# Patient Record
Sex: Male | Born: 1990 | Race: Black or African American | Hispanic: No | Marital: Single | Smoking: Never smoker
Health system: Southern US, Community
[De-identification: ages and names within clinical notes are randomized; demographics above are authoritative.]

## PROBLEM LIST (undated history)

## (undated) ENCOUNTER — Ambulatory Visit (HOSPITAL_COMMUNITY): Admission: EM | Payer: BC Managed Care – PPO | Source: Home / Self Care

## (undated) HISTORY — PX: APPENDECTOMY: SHX54

---
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
## 2010-02-14 ENCOUNTER — Ambulatory Visit: Payer: Self-pay | Admitting: Physician Assistant

## 2010-02-28 ENCOUNTER — Encounter: Payer: Self-pay | Admitting: Physician Assistant

## 2010-03-03 ENCOUNTER — Encounter: Payer: Self-pay | Admitting: Physician Assistant

## 2010-03-23 ENCOUNTER — Encounter: Payer: Self-pay | Admitting: Physician Assistant

## 2010-07-04 NOTE — Miscellaneous (Signed)
  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

## 2010-07-04 NOTE — Letter (Signed)
Summary: IMMUNIZATION RECORD  IMMUNIZATION RECORD   Imported By: Arta Bruce 03/23/2010 10:39:37  _____________________________________________________________________  External Attachment:    Type:   Image     Comment:   External Document

## 2010-07-04 NOTE — Letter (Signed)
Summary: PT INFORMATION SHEET  PT INFORMATION SHEET   Imported By: Arta Bruce 02/16/2010 10:48:23  _____________________________________________________________________  External Attachment:    Type:   Image     Comment:   External Document

## 2010-07-04 NOTE — Assessment & Plan Note (Signed)
Summary: new medicaid pt/ first est care/gk   Vital Signs:  Patient profile:   20 year old male Height:      67.52 inches Weight:      142 pounds BMI:     21.98 Temp:     98.3 degrees F oral Pulse rate:   52 / minute Pulse rhythm:   regular Resp:     20 per minute BP sitting:   124 / 80  (left arm) Cuff size:   regular  Vitals Entered By: CMA Studnet Linzie Collin CC: new patient visit, no current issue, medications verified Is Patient Diabetic? No Pain Assessment Patient in pain? no       Does patient need assistance? Functional Status Self care Ambulation Normal   Primary Care Provider:  Tereso Newcomer, PA-C  CC:  new patient visit, no current issue, and medications verified.  History of Present Illness: New pt. Previously going to [facility]. No complaints. Sees optometrist and dentist. Goes to [employer] and wants to transfer to a 4 yr university.   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   Head:  normocephalic and atraumatic.   Eyes:  pupils equal, pupils round, and pupils reactive to light.   Ears:  R ear normal and L ear normal.   Nose:  no external deformity.   Mouth:  pharynx pink and moist.   Neck:  supple and no cervical lymphadenopathy.   Lungs:  normal breath sounds.   Heart:  normal rate and regular rhythm.   Abdomen:  soft, non-tender, and no hepatomegaly.   Extremities:  no edema Neurologic:  alert & oriented X3 and cranial  nerves II-XII intact.   Psych:  normally interactive.     Impression & Recommendations:  Problem # 1:  PREVENTIVE HEALTH CARE (ICD-V70.0) will get imm's records from [facility]  Patient Instructions: 1)  Please request immunization records from [facility]. 2)  Please schedule a follow-up appointment as needed .

## 2010-07-04 NOTE — Letter (Signed)
Summary: PPD SKIN TEST  PPD SKIN TEST   Imported By: Arta Bruce 03/23/2010 10:56:58  _____________________________________________________________________  External Attachment:    Type:   Image     Comment:   External Document

## 2010-07-04 NOTE — Letter (Signed)
Summary: REQUESTING RECORDS FROM [facility]  REQUESTING RECORDS FROM [facility]   Imported By: Arta Bruce 03/06/2010 12:15:33  _____________________________________________________________________  External Attachment:    Type:   Image     Comment:   External Document

## 2010-07-31 ENCOUNTER — Inpatient Hospital Stay (INDEPENDENT_AMBULATORY_CARE_PROVIDER_SITE_OTHER)
Admission: RE | Admit: 2010-07-31 | Discharge: 2010-07-31 | Disposition: A | Discharge status: Home / Self Care | Payer: Medicaid Other | Source: Ambulatory Visit | Attending: Emergency Medicine | Admitting: Emergency Medicine

## 2010-07-31 DIAGNOSIS — R51 Headache: Secondary | ICD-10-CM

## 2010-09-11 LAB — CBC
HCT: 45.5 % (ref 39.0–52.0)
Hemoglobin: 14.3 g/dL (ref 13.0–17.0)
MCHC: 31.5 g/dL (ref 30.0–36.0)
MCV: 75.3 fL — ABNORMAL LOW (ref 78.0–100.0)
RDW: 14.7 % (ref 11.5–15.5)

## 2010-09-11 LAB — DIFFERENTIAL
Basophils Absolute: 0.1 10*3/uL (ref 0.0–0.1)
Basophils Relative: 1 % (ref 0–1)
Lymphocytes Relative: 17 % (ref 12–46)
Monocytes Relative: 16 % — ABNORMAL HIGH (ref 3–12)
Neutro Abs: 3 10*3/uL (ref 1.7–7.7)
Neutrophils Relative %: 65 % (ref 43–77)

## 2010-09-11 LAB — COMPREHENSIVE METABOLIC PANEL
Alkaline Phosphatase: 112 U/L (ref 39–117)
BUN: 16 mg/dL (ref 6–23)
Creatinine, Ser: 1.1 mg/dL (ref 0.4–1.5)
Glucose, Bld: 80 mg/dL (ref 70–99)
Potassium: 4.7 mEq/L (ref 3.5–5.1)
Total Protein: 7.7 g/dL (ref 6.0–8.3)

## 2010-09-19 ENCOUNTER — Ambulatory Visit: Payer: Medicaid Other | Admitting: Family Medicine

## 2010-11-09 ENCOUNTER — Ambulatory Visit: Payer: Medicaid Other | Admitting: Family Medicine

## 2011-03-01 LAB — DIFFERENTIAL
Lymphocytes Relative: 13 — ABNORMAL LOW
Monocytes Absolute: 0.5
Monocytes Relative: 5
Neutro Abs: 7.2

## 2011-03-01 LAB — CBC
Hemoglobin: 12.5
MCHC: 32.3
RBC: 5.3

## 2011-03-07 ENCOUNTER — Emergency Department (HOSPITAL_COMMUNITY)
Admission: EM | Admit: 2011-03-07 | Discharge: 2011-03-07 | Discharge status: Against Medical Advice | Payer: Self-pay | Attending: Emergency Medicine | Admitting: Emergency Medicine

## 2011-03-07 DIAGNOSIS — H5789 Other specified disorders of eye and adnexa: Secondary | ICD-10-CM | POA: Insufficient documentation

## 2015-11-04 ENCOUNTER — Encounter (HOSPITAL_COMMUNITY): Payer: Self-pay | Admitting: Emergency Medicine

## 2015-11-04 ENCOUNTER — Emergency Department (HOSPITAL_COMMUNITY)
Admission: EM | Admit: 2015-11-04 | Discharge: 2015-11-04 | Disposition: A | Discharge status: Home / Self Care | Payer: Medicaid Other | Attending: Emergency Medicine | Admitting: Emergency Medicine

## 2015-11-04 DIAGNOSIS — A084 Viral intestinal infection, unspecified: Secondary | ICD-10-CM | POA: Insufficient documentation

## 2015-11-04 LAB — CBC
HEMATOCRIT: 42.2 % (ref 39.0–52.0)
Hemoglobin: 13.8 g/dL (ref 13.0–17.0)
MCH: 24 pg — ABNORMAL LOW (ref 26.0–34.0)
MCHC: 32.7 g/dL (ref 30.0–36.0)
MCV: 73.4 fL — ABNORMAL LOW (ref 78.0–100.0)
Platelets: 179 10*3/uL (ref 150–400)
RBC: 5.75 MIL/uL (ref 4.22–5.81)
RDW: 14.3 % (ref 11.5–15.5)
WBC: 9.6 10*3/uL (ref 4.0–10.5)

## 2015-11-04 LAB — LIPASE, BLOOD: Lipase: 19 U/L (ref 11–51)

## 2015-11-04 LAB — COMPREHENSIVE METABOLIC PANEL
ALK PHOS: 43 U/L (ref 38–126)
ALT: 14 U/L — ABNORMAL LOW (ref 17–63)
ANION GAP: 9 (ref 5–15)
AST: 26 U/L (ref 15–41)
Albumin: 4.4 g/dL (ref 3.5–5.0)
BILIRUBIN TOTAL: 1.3 mg/dL — AB (ref 0.3–1.2)
BUN: 17 mg/dL (ref 6–20)
CALCIUM: 9.2 mg/dL (ref 8.9–10.3)
CO2: 24 mmol/L (ref 22–32)
Chloride: 103 mmol/L (ref 101–111)
Creatinine, Ser: 1.07 mg/dL (ref 0.61–1.24)
GFR calc Af Amer: >60 mL/min (ref >60)
Glucose, Bld: 155 mg/dL — ABNORMAL HIGH (ref 65–99)
POTASSIUM: 3.4 mmol/L — AB (ref 3.5–5.1)
Sodium: 136 mmol/L (ref 135–145)
TOTAL PROTEIN: 7.5 g/dL (ref 6.5–8.1)

## 2015-11-04 MED ORDER — SODIUM CHLORIDE 0.9 % IV BOLUS (SEPSIS)
1000.0000 mL | Freq: Once | INTRAVENOUS | Status: AC
  Administered 2015-11-04: 1000 mL via INTRAVENOUS

## 2015-11-04 MED ORDER — KETOROLAC TROMETHAMINE 30 MG/ML IJ SOLN
30.0000 mg | Freq: Once | INTRAMUSCULAR | Status: AC
  Administered 2015-11-04: 30 mg via INTRAVENOUS
  Filled 2015-11-04: qty 1

## 2015-11-04 MED ORDER — METOCLOPRAMIDE HCL 5 MG/ML IJ SOLN
10.0000 mg | INTRAMUSCULAR | Status: AC
  Administered 2015-11-04: 10 mg via INTRAVENOUS
  Filled 2015-11-04: qty 2

## 2015-11-04 MED ORDER — DICYCLOMINE HCL 20 MG PO TABS: 20.0000 mg | ORAL_TABLET | Freq: Two times a day (BID) | ORAL | Status: DC

## 2015-11-04 MED ORDER — LORAZEPAM 2 MG/ML IJ SOLN
1.0000 mg | Freq: Once | INTRAMUSCULAR | Status: AC
  Administered 2015-11-04: 1 mg via INTRAVENOUS
  Filled 2015-11-04: qty 1

## 2015-11-04 MED ORDER — ONDANSETRON HCL 4 MG/2ML IJ SOLN
4.0000 mg | Freq: Once | INTRAMUSCULAR | Status: AC | PRN
  Administered 2015-11-04: 4 mg via INTRAVENOUS
  Filled 2015-11-04: qty 2

## 2015-11-04 MED ORDER — MORPHINE SULFATE (PF) 4 MG/ML IV SOLN
4.0000 mg | Freq: Once | INTRAVENOUS | Status: AC
Start: 2015-11-04 — End: 2015-11-04
  Administered 2015-11-04: 4 mg via INTRAVENOUS
  Filled 2015-11-04: qty 1

## 2015-11-04 MED ORDER — PROMETHAZINE HCL 25 MG RE SUPP: 25.0000 mg | Freq: Four times a day (QID) | RECTAL | Status: DC | PRN

## 2015-11-04 MED ORDER — ONDANSETRON 4 MG PO TBDP: 4.0000 mg | ORAL_TABLET | Freq: Three times a day (TID) | ORAL | Status: DC | PRN

## 2015-11-04 NOTE — ED Provider Notes (Signed)
CSN: [id number]     Arrival date & time 11/04/15  0049 History   First MD Initiated Contact with Patient 11/04/15 0121     Chief Complaint  Patient presents with  . Abdominal Pain     (Consider location/radiation/quality/duration/timing/severity/associated sxs/prior Treatment) HPI Comments: 25 year old male with a history of appendectomy presents to the emergency department for evaluation of vomiting and diarrhea which began at approximately 2000 yesterday evening, approximately 45 minutes after eating [employer]. He denies anybody else eating [employer] tonight. He has had approximately 10-15 episodes of nonbloody, nonbilious emesis since onset. He reports 4 episodes of watery diarrhea. Patient denies taking any medications for his symptoms. He is complaining of generalized abdominal discomfort which is aching. He has had no fever, hematemesis, melena, hematochezia, or urinary symptoms. He does feel generally weak and fatigued. Patient unable to tolerate food or fluids since onset of symptoms.  Patient is a 25 y.o. male presenting with abdominal pain. The history is provided by the patient. No language interpreter was used.  Abdominal Pain Associated symptoms: diarrhea, nausea and vomiting     History reviewed. No pertinent past medical history. Past Surgical History  Procedure Laterality Date  . Appendectomy     Family History  Problem Relation Age of Onset  . Hypertension Other    Social History  Substance Use Topics  . Smoking status: Never Smoker   . Smokeless tobacco: None  . Alcohol Use: Yes     Comment: occ    Review of Systems  Gastrointestinal: Positive for nausea, vomiting, abdominal pain and diarrhea.  All other systems reviewed and are negative.   Allergies  Review of patient's allergies indicates no known allergies.  Home Medications   Prior to Admission medications   Not on File   BP 128/65 mmHg  Pulse 63  Temp(Src) 98.3 F (36.8 C) (Oral)  Resp 16   Ht  (1.727 m)  Wt 68.04 kg  BMI 22.81 kg/m2  SpO2 100%   Physical Exam  Constitutional: He is oriented to person, place, and time. He appears well-developed and well-nourished. No distress.  Nontoxic appearing and in no distress  HENT:  Head: Normocephalic and atraumatic.  Eyes: Conjunctivae and EOM are normal. No scleral icterus.  Neck: Normal range of motion.  Cardiovascular: Normal rate, regular rhythm and intact distal pulses.   Pulmonary/Chest: Effort normal. No respiratory distress. He has no wheezes. He has no rales.  Respirations even and unlabored. Lungs clear.  Abdominal: Soft. He exhibits no distension. There is tenderness. There is no rebound and no guarding.  Generalized tenderness to palpation. No masses. No peritoneal signs.  Musculoskeletal: Normal range of motion.  Neurological: He is alert and oriented to person, place, and time. He exhibits normal muscle tone. Coordination normal.  Skin: Skin is warm and dry. No rash noted. He is not diaphoretic. No erythema. No pallor.  Psychiatric: He has a normal mood and affect. His behavior is normal.  Nursing note and vitals reviewed.   ED Course  Procedures (including critical care time) Labs Review Labs Reviewed  COMPREHENSIVE METABOLIC PANEL - Abnormal; Notable for the following:    Potassium 3.4 (*)    Glucose, Bld 155 (*)    ALT 14 (*)    Total Bilirubin 1.3 (*)    All other components within normal limits  CBC - Abnormal; Notable for the following:    MCV 73.4 (*)    MCH 24.0 (*)    All other components within normal limits  LIPASE, BLOOD  URINALYSIS, ROUTINE W REFLEX MICROSCOPIC (NOT AT [facility])    Imaging Review No results found.   I have personally reviewed and evaluated these images and lab results as part of my medical decision-making.   EKG Interpretation None      4:42 AM Patient sipping ginger ale with 1 episode of emesis. Additional zofran ordered and will add ativan. Abdominal repeat exam  is stable. Patient states he is feeling better. No c/o pain.  5:39 AM Patient taking small sips without further emesis. Plan to d/c with supportive care.  MDM   Final diagnoses:  Viral gastroenteritis    Patient with symptoms consistent with viral gastroenteritis vs food-related illness. Symptom onset was this evening, 1 hour after eating [employer]. Vitals are stable, no fever. Lungs are clear. No focal abdominal pain or leukocytosis. LFTs normal. Doubt cholecystitis, pancreatitis, ruptured viscus, UTI, kidney stone, or other emergent abdominal etiology. He has hx of appendectomy.   Patient able to tolerate PO fluids after antiemetics. Abdominal repeat exam is stable. I do not believe imaging is indicated at this time. Supportive therapy indicated with return if symptoms worsen. Return precautions discussed and provided. Patient discharged in satisfactory condition with no unaddressed concerns.   Filed Vitals:   11/04/15 0108 11/04/15 0432  BP: 128/65 123/54  Pulse: 63 60  Temp: 98.3 F (36.8 C) 98.6 F (37 C)  TempSrc: Oral Oral  Resp: 16 16  Height: 5\' 8"  (1.727 m)   Weight: 68.04 kg   SpO2: 100% 100%     Antony MaduraKelly Glenn Gullickson, PA-C 11/04/15 0543  Azalia BilisKevin Campos, MD 11/04/15 [phone]

## 2015-11-04 NOTE — ED Notes (Signed)
Pt provided urinal & informed urine specimen needed.  Pt verbalized understanding.

## 2015-11-04 NOTE — Discharge Instructions (Signed)

## 2015-11-04 NOTE — ED Notes (Signed)
Pt was given ginger ale for fluid challenge, per his request. After few sips pt observed vomiting. PA Humes notified.

## 2015-11-04 NOTE — ED Notes (Signed)
Pt states he started having abd pain with nausea, vomiting, and diarrhea around 2000 yesterday evening  Pt states he feels weak all over

## 2015-11-06 ENCOUNTER — Encounter (HOSPITAL_COMMUNITY): Payer: Self-pay

## 2015-11-06 ENCOUNTER — Emergency Department (HOSPITAL_COMMUNITY)
Admission: EM | Admit: 2015-11-06 | Discharge: 2015-11-06 | Disposition: A | Discharge status: Home / Self Care | Payer: Self-pay | Attending: Emergency Medicine | Admitting: Emergency Medicine

## 2015-11-06 ENCOUNTER — Emergency Department (HOSPITAL_COMMUNITY): Payer: Self-pay

## 2015-11-06 DIAGNOSIS — Z79899 Other long term (current) drug therapy: Secondary | ICD-10-CM | POA: Insufficient documentation

## 2015-11-06 DIAGNOSIS — R748 Abnormal levels of other serum enzymes: Secondary | ICD-10-CM | POA: Insufficient documentation

## 2015-11-06 DIAGNOSIS — K529 Noninfective gastroenteritis and colitis, unspecified: Secondary | ICD-10-CM | POA: Insufficient documentation

## 2015-11-06 LAB — COMPREHENSIVE METABOLIC PANEL
ALBUMIN: 4.8 g/dL (ref 3.5–5.0)
ALK PHOS: 43 U/L (ref 38–126)
ALT: 43 U/L (ref 17–63)
AST: 199 U/L — AB (ref 15–41)
Anion gap: 10 (ref 5–15)
BILIRUBIN TOTAL: 1 mg/dL (ref 0.3–1.2)
BUN: 17 mg/dL (ref 6–20)
CALCIUM: 9.7 mg/dL (ref 8.9–10.3)
CO2: 25 mmol/L (ref 22–32)
Chloride: 102 mmol/L (ref 101–111)
Creatinine, Ser: 1.17 mg/dL (ref 0.61–1.24)
GFR calc Af Amer: >60 mL/min (ref >60)
GFR calc non Af Amer: >60 mL/min (ref >60)
GLUCOSE: 110 mg/dL — AB (ref 65–99)
Potassium: 4.7 mmol/L (ref 3.5–5.1)
Sodium: 137 mmol/L (ref 135–145)
TOTAL PROTEIN: 9 g/dL — AB (ref 6.5–8.1)

## 2015-11-06 LAB — URINE MICROSCOPIC-ADD ON: SQUAMOUS EPITHELIAL / LPF: NONE SEEN

## 2015-11-06 LAB — CBC
HCT: 42.8 % (ref 39.0–52.0)
Hemoglobin: 14.5 g/dL (ref 13.0–17.0)
MCH: 24.3 pg — AB (ref 26.0–34.0)
MCHC: 33.9 g/dL (ref 30.0–36.0)
MCV: 71.8 fL — ABNORMAL LOW (ref 78.0–100.0)
PLATELETS: 190 10*3/uL (ref 150–400)
RBC: 5.96 MIL/uL — AB (ref 4.22–5.81)
RDW: 14.5 % (ref 11.5–15.5)
WBC: 8.9 10*3/uL (ref 4.0–10.5)

## 2015-11-06 LAB — URINALYSIS, ROUTINE W REFLEX MICROSCOPIC
Bilirubin Urine: NEGATIVE
GLUCOSE, UA: NEGATIVE mg/dL
KETONES UR: 15 mg/dL — AB
Nitrite: NEGATIVE
PROTEIN: 100 mg/dL — AB
Specific Gravity, Urine: 1.044 — ABNORMAL HIGH (ref 1.005–1.030)
pH: 6 (ref 5.0–8.0)

## 2015-11-06 LAB — LIPASE, BLOOD: Lipase: 128 U/L — ABNORMAL HIGH (ref 11–51)

## 2015-11-06 MED ORDER — FAMOTIDINE IN NACL 20-0.9 MG/50ML-% IV SOLN
20.0000 mg | Freq: Once | INTRAVENOUS | Status: AC
  Administered 2015-11-06: 20 mg via INTRAVENOUS
  Filled 2015-11-06: qty 50

## 2015-11-06 MED ORDER — OMEPRAZOLE 20 MG PO CPDR: 20.0000 mg | DELAYED_RELEASE_CAPSULE | Freq: Every day | ORAL | Status: DC

## 2015-11-06 MED ORDER — ONDANSETRON 4 MG PO TBDP: 8.0000 mg | ORAL_TABLET | Freq: Three times a day (TID) | ORAL | Status: DC | PRN

## 2015-11-06 MED ORDER — SODIUM CHLORIDE 0.9 % IV BOLUS (SEPSIS)
1000.0000 mL | Freq: Once | INTRAVENOUS | Status: AC
  Administered 2015-11-06: 1000 mL via INTRAVENOUS

## 2015-11-06 NOTE — ED Notes (Signed)
Pt having abdominal pain and emesis since Thursday morning. Seen in ER on Thursday and sent home with meds.  Not getting relief.  No fever.  No change in urination.

## 2015-11-06 NOTE — Discharge Instructions (Signed)
We saw you in the ER for the nausea and vomiting. All the results in the ER are normal - EXCEPT FOR THE ELEVATED LIPASE. We are not sure what is causing your symptoms. We dont think you have pancreatitis. The workup in the ER is not complete, and is limited to screening for life threatening and emergent conditions only, SEE GI DOCTOR.  Please return to the ER if your symptoms worsen; you have increased pain, fevers, chills, inability to keep any medications down, bloody stools or bloody vomitus. Otherwise see the outpatient doctor as requested.  Dehydration, Adult Dehydration is a condition in which you do not have enough fluid or water in your body. It happens when you take in less fluid than you lose. Vital organs such as the kidneys, brain, and heart cannot function without a proper amount of fluids. Any loss of fluids from the body can cause dehydration.  Dehydration can range from mild to severe. This condition should be treated right away to help prevent it from becoming severe. CAUSES  This condition may be caused by:  Vomiting.  Diarrhea.  Excessive sweating, such as when exercising in hot or humid weather.  Not drinking enough fluid during strenuous exercise or during an illness.  Excessive urine output.  Fever.  Certain medicines. RISK FACTORS This condition is more likely to develop in:  People who are taking certain medicines that cause the body to lose excess fluid (diuretics).   People who have a chronic illness, such as diabetes, that may increase urination.  Older adults.   People who live at high altitudes.   People who participate in endurance sports.  SYMPTOMS  Mild Dehydration  Thirst.  Dry lips.  Slightly dry mouth.  Dry, warm skin. Moderate Dehydration  Very dry mouth.   Muscle cramps.   Dark urine and decreased urine production.   Decreased tear production.   Headache.   Light-headedness, especially when you stand up from a  sitting position.  Severe Dehydration  Changes in skin.   Cold and clammy skin.   Skin does not spring back quickly when lightly pinched and released.   Changes in body fluids.   Extreme thirst.   No tears.   Not able to sweat when body temperature is high, such as in hot weather.   Minimal urine production.   Changes in vital signs.   Rapid, weak pulse (more than 100 beats per minute when you are sitting still).   Rapid breathing.   Low blood pressure.   Other changes.   Sunken eyes.   Cold hands and feet.   Confusion.  Lethargy and difficulty being awakened.  Fainting (syncope).   Short-term weight loss.   Unconsciousness. DIAGNOSIS  This condition may be diagnosed based on your symptoms. You may also have tests to determine how severe your dehydration is. These tests may include:   Urine tests.   Blood tests.  TREATMENT  Treatment for this condition depends on the severity. Mild or moderate dehydration can often be treated at home. Treatment should be started right away. Do not wait until dehydration becomes severe. Severe dehydration needs to be treated at the hospital. Treatment for Mild Dehydration  Drinking plenty of water to replace the fluid you have lost.   Replacing minerals in your blood (electrolytes) that you may have lost.  Treatment for Moderate Dehydration  Consuming oral rehydration solution (ORS). Treatment for Severe Dehydration  Receiving fluid through an IV tube.   Receiving electrolyte solution through  a feeding tube that is passed through your nose and into your stomach (nasogastric tube or NG tube).  Correcting any abnormalities in electrolytes. HOME CARE INSTRUCTIONS   Drink enough fluid to keep your urine clear or pale yellow.   Drink water or fluid slowly by taking small sips. You can also try sucking on ice cubes.  Have food or beverages that contain electrolytes. Examples include bananas and  sports drinks.  Take over-the-counter and prescription medicines only as told by your health care provider.   Prepare ORS according to the manufacturer's instructions. Take sips of ORS every 5 minutes until your urine returns to normal.  If you have vomiting or diarrhea, continue to try to drink water, ORS, or both.   If you have diarrhea, avoid:   Beverages that contain caffeine.   Fruit juice.   Milk.   Carbonated soft drinks.  Do not take salt tablets. This can lead to the condition of having too much sodium in your body (hypernatremia).  SEEK MEDICAL CARE IF:  You cannot eat or drink without vomiting.  You have had moderate diarrhea during a period of more than 24 hours.  You have a fever. SEEK IMMEDIATE MEDICAL CARE IF:   You have extreme thirst.  You have severe diarrhea.  You have not urinated in 6-8 hours, or you have urinated only a small amount of very dark urine.  You have shriveled skin.  You are dizzy, confused, or both.   This information is not intended to replace advice given to you by your health care provider. Make sure you discuss any questions you have with your health care provider.   Document Released: 05/21/2005 Document Revised: 02/09/2015 Document Reviewed: 10/06/2014 Elsevier Interactive Patient Education [employer]2016 Elsevier Inc.

## 2015-11-06 NOTE — ED Provider Notes (Signed)
CSN: [id number]     Arrival date & time 11/06/15  1244 History   First MD Initiated Contact with Patient 11/06/15 1319     Chief Complaint  Patient presents with  . Abdominal Pain  . Emesis     (Consider location/radiation/quality/duration/timing/severity/associated sxs/prior Treatment) HPI Comments: Pt comes in with cc of abd pain and emesis. Pt has hx of appendectomy. Pt reports that he started having emesis after eating fast food on Thursday. Since then, he has had persistent emesis, despite taking meds for nausea. Pt had 10 episodes of emesis y'day, all preceded by po intake. Last BM was Thursday. He is not quite sure if he is passing flatus. Pt has generalized abd discomfort now, and feels weak as he hasnt been able to tolerate oral intake.   ROS 10 Systems reviewed and are negative for acute change except as noted in the HPI.     Patient is a 25 y.o. male presenting with abdominal pain and vomiting. The history is provided by the patient.  Abdominal Pain Associated symptoms: vomiting   Emesis Associated symptoms: abdominal pain     History reviewed. No pertinent past medical history. Past Surgical History  Procedure Laterality Date  . Appendectomy     Family History  Problem Relation Age of Onset  . Hypertension Other    Social History  Substance Use Topics  . Smoking status: Never Smoker   . Smokeless tobacco: None  . Alcohol Use: Yes     Comment: occ    Review of Systems  Gastrointestinal: Positive for vomiting and abdominal pain.      Allergies  Review of patient's allergies indicates no known allergies.  Home Medications   Prior to Admission medications   Medication Sig Start Date End Date Taking? Authorizing Provider  dicyclomine (BENTYL) 20 MG tablet Take 1 tablet (20 mg total) by mouth 2 (two) times daily. Take, as needed, for abdominal cramping. 11/04/15  Yes Antony MaduraKelly Humes, PA-C  promethazine (PHENERGAN) 25 MG suppository Place 1 suppository (25 mg  total) rectally every 6 (six) hours as needed for nausea or vomiting (Use ONLY if unable to tolerate zofran by mouth). 11/04/15  Yes Antony MaduraKelly Humes, PA-C  omeprazole (PRILOSEC) 20 MG capsule Take 1 capsule (20 mg total) by mouth daily. 11/06/15   Derwood KaplanAnkit Anniece Bleiler, MD  ondansetron (ZOFRAN ODT) 4 MG disintegrating tablet Take 2 tablets (8 mg total) by mouth every 8 (eight) hours as needed for nausea or vomiting. 11/06/15   Delailah Spieth, MD   BP 129/66 mmHg  Pulse 40  Temp(Src) 98.4 F (36.9 C) (Oral)  Resp 18  SpO2 100% Physical Exam  Constitutional: He is oriented to person, place, and time. He appears well-developed.  HENT:  Head: Atraumatic.  Neck: Neck supple.  Cardiovascular: Normal rate.   Pulmonary/Chest: Effort normal.  Neurological: He is alert and oriented to person, place, and time.  Skin: Skin is warm.  Nursing note and vitals reviewed.   ED Course  Procedures (including critical care time) Labs Review Labs Reviewed  LIPASE, BLOOD - Abnormal; Notable for the following:    Lipase 128 (*)    All other components within normal limits  COMPREHENSIVE METABOLIC PANEL - Abnormal; Notable for the following:    Glucose, Bld 110 (*)    Total Protein 9.0 (*)    AST 199 (*)    All other components within normal limits  URINALYSIS, ROUTINE W REFLEX MICROSCOPIC (NOT AT [facility]) - Abnormal; Notable for the following:  Color, Urine AMBER (*)    Specific Gravity, Urine 1.044 (*)    Hgb urine dipstick TRACE (*)    Ketones, ur 15 (*)    Protein, ur 100 (*)    Leukocytes, UA SMALL (*)    All other components within normal limits  CBC - Abnormal; Notable for the following:    RBC 5.96 (*)    MCV 71.8 (*)    MCH 24.3 (*)    All other components within normal limits  URINE MICROSCOPIC-ADD ON - Abnormal; Notable for the following:    Bacteria, UA RARE (*)    All other components within normal limits    Imaging Review Dg Abd Acute W/chest  11/06/2015  CLINICAL DATA:  Abdominal pain with  nausea and vomiting for 3 days EXAM: DG ABDOMEN ACUTE W/ 1V CHEST COMPARISON:  Chest radiograph Oct 20, 2007 FINDINGS: PA chest: Lungs are clear. Heart size and pulmonary vascularity are normal. No adenopathy. Supine and left lateral decubitus abdomen: There is moderate stool in the colon. There is no bowel dilatation or air-fluid level suggesting obstruction. No free air. No abnormal calcifications. IMPRESSION: No demonstrable bowel obstruction or free air. Moderate stool in colon. Lungs clear. Electronically Signed   By: Bretta Bang III M.D.   On: 11/06/2015 14:43   I have personally reviewed and evaluated these images and lab results as part of my medical decision-making.   EKG Interpretation None      MDM   Final diagnoses:  Elevated lipase  Gastroenteritis   Pt comes in with cc of nausea and emesis. Symptoms persistent. Evoked by po intake. There is no focal abd tenderness. No bloody stools or emesis. No BM x 2 days. AAS shows no signs of obstruction.  Pt's labs show lipase elevation. AST is also elevated. Both are elevated compared to last visit. UA shows dehydration.  Repeat exam is still non focal. CT is not indicated. Oral challenge - passed.  Strict ER return precautions have been discussed, and patient is agreeing with the plan and is comfortable with the workup done and the recommendations from the ER.    Derwood Kaplan, MD 11/06/15 1712

## 2015-11-06 NOTE — ED Notes (Signed)
Pt cannot use restroom at this time, aware urine specimen is needed.  

## 2015-11-06 NOTE — ED Notes (Signed)
Cbc recollect

## 2015-11-06 NOTE — ED Notes (Signed)
Pt given po fluids. 

## 2015-11-06 NOTE — ED Notes (Signed)
Pt able to keep down water  

## 2016-04-17 ENCOUNTER — Encounter (HOSPITAL_COMMUNITY): Payer: Self-pay | Admitting: Emergency Medicine

## 2016-04-17 ENCOUNTER — Emergency Department (HOSPITAL_COMMUNITY)
Admission: EM | Admit: 2016-04-17 | Discharge: 2016-04-17 | Disposition: A | Discharge status: Home / Self Care | Payer: Medicaid Other | Attending: Emergency Medicine | Admitting: Emergency Medicine

## 2016-04-17 DIAGNOSIS — E876 Hypokalemia: Secondary | ICD-10-CM

## 2016-04-17 DIAGNOSIS — A084 Viral intestinal infection, unspecified: Secondary | ICD-10-CM

## 2016-04-17 DIAGNOSIS — Z79899 Other long term (current) drug therapy: Secondary | ICD-10-CM | POA: Insufficient documentation

## 2016-04-17 DIAGNOSIS — D72829 Elevated white blood cell count, unspecified: Secondary | ICD-10-CM

## 2016-04-17 DIAGNOSIS — R001 Bradycardia, unspecified: Secondary | ICD-10-CM

## 2016-04-17 DIAGNOSIS — R197 Diarrhea, unspecified: Secondary | ICD-10-CM

## 2016-04-17 DIAGNOSIS — R112 Nausea with vomiting, unspecified: Secondary | ICD-10-CM

## 2016-04-17 LAB — URINALYSIS, ROUTINE W REFLEX MICROSCOPIC
Bilirubin Urine: NEGATIVE
Glucose, UA: NEGATIVE mg/dL
Hgb urine dipstick: NEGATIVE
Ketones, ur: 15 mg/dL — AB
LEUKOCYTES UA: NEGATIVE
NITRITE: NEGATIVE
PH: 6 (ref 5.0–8.0)
Protein, ur: 30 mg/dL — AB
SPECIFIC GRAVITY, URINE: 1.041 — AB (ref 1.005–1.030)

## 2016-04-17 LAB — COMPREHENSIVE METABOLIC PANEL
ALBUMIN: 4.7 g/dL (ref 3.5–5.0)
ALT: 16 U/L — ABNORMAL LOW (ref 17–63)
ANION GAP: 11 (ref 5–15)
AST: 40 U/L (ref 15–41)
Alkaline Phosphatase: 50 U/L (ref 38–126)
BILIRUBIN TOTAL: 1.1 mg/dL (ref 0.3–1.2)
BUN: 15 mg/dL (ref 6–20)
CHLORIDE: 103 mmol/L (ref 101–111)
CO2: 24 mmol/L (ref 22–32)
Calcium: 9.6 mg/dL (ref 8.9–10.3)
Creatinine, Ser: 1.16 mg/dL (ref 0.61–1.24)
GFR calc Af Amer: >60 mL/min (ref >60)
GFR calc non Af Amer: >60 mL/min (ref >60)
GLUCOSE: 126 mg/dL — AB (ref 65–99)
POTASSIUM: 3.3 mmol/L — AB (ref 3.5–5.1)
Sodium: 138 mmol/L (ref 135–145)
TOTAL PROTEIN: 8 g/dL (ref 6.5–8.1)

## 2016-04-17 LAB — CBC
HEMATOCRIT: 44.4 % (ref 39.0–52.0)
HEMOGLOBIN: 14.3 g/dL (ref 13.0–17.0)
MCH: 23.9 pg — ABNORMAL LOW (ref 26.0–34.0)
MCHC: 32.2 g/dL (ref 30.0–36.0)
MCV: 74.1 fL — AB (ref 78.0–100.0)
Platelets: 190 10*3/uL (ref 150–400)
RBC: 5.99 MIL/uL — ABNORMAL HIGH (ref 4.22–5.81)
RDW: 14.1 % (ref 11.5–15.5)
WBC: 12.9 10*3/uL — AB (ref 4.0–10.5)

## 2016-04-17 LAB — URINE MICROSCOPIC-ADD ON

## 2016-04-17 LAB — LIPASE, BLOOD: LIPASE: 23 U/L (ref 11–51)

## 2016-04-17 MED ORDER — ONDANSETRON 4 MG PO TBDP
4.0000 mg | ORAL_TABLET | Freq: Once | ORAL | Status: AC | PRN
  Administered 2016-04-17: 4 mg via ORAL
  Filled 2016-04-17: qty 1

## 2016-04-17 MED ORDER — PROMETHAZINE HCL 25 MG/ML IJ SOLN
25.0000 mg | Freq: Once | INTRAMUSCULAR | Status: AC
  Administered 2016-04-17: 25 mg via INTRAVENOUS
  Filled 2016-04-17: qty 1

## 2016-04-17 MED ORDER — POTASSIUM CHLORIDE CRYS ER 20 MEQ PO TBCR
40.0000 meq | EXTENDED_RELEASE_TABLET | Freq: Once | ORAL | Status: AC
  Administered 2016-04-17: 40 meq via ORAL
  Filled 2016-04-17: qty 2

## 2016-04-17 MED ORDER — SODIUM CHLORIDE 0.9 % IV BOLUS (SEPSIS)
2000.0000 mL | Freq: Once | INTRAVENOUS | Status: AC
  Administered 2016-04-17: 2000 mL via INTRAVENOUS

## 2016-04-17 MED ORDER — PROMETHAZINE HCL 25 MG PO TABS: 25.0000 mg | ORAL_TABLET | Freq: Four times a day (QID) | ORAL | 0 refills | Status: DC | PRN

## 2016-04-17 NOTE — Discharge Instructions (Addendum)
Use phenergan as prescribed, as needed for nausea. Use tylenol or motrin as needed for pain. Stay well hydrated with small sips of fluids throughout the day. Follow a BRAT (banana-rice-applesauce-toast) diet as described below for the next 24-48 hours. The 'BRAT' diet is suggested, then progress to diet as tolerated as symptoms abate. Call your regular doctor if bloody stools, persistent diarrhea, vomiting, fever or abdominal pain. Follow up with your regular doctor in 1 week for recheck of symptoms. Return to ER for changing or worsening of symptoms. °

## 2016-04-17 NOTE — ED Triage Notes (Signed)
Pt complaint of n/v/d onset yesterday. 

## 2016-04-17 NOTE — ED Notes (Signed)
Pt is aware that a urine sample is needed but is unable to obtain one at this time. 

## 2016-04-17 NOTE — ED Provider Notes (Signed)
[facility]-EMERGENCY DEPT Provider Note   CSN: [id number] Arrival date & time: 04/17/16  1349     History   Chief Complaint Chief Complaint  Patient presents with  . Emesis  . Diarrhea    HPI Ronnie GaussJoel Estes is a [age] y.o. male with a PSHx of appendectomy, who presents to the ED with complaints of nausea, vomiting, diarrhea, and generalized abdominal pain that began earlier today. He describes his pain as 8/10 constant aching generalized abdominal pain that is nonradiating, with no known aggravating or alleviating factors given that he has not tried anything prior to arrival. He states he's had "a lot" of nonbloody nonbilious emesis, and 2 episodes of watery nonbloody diarrhea today. He denies any fevers, chills, chest pain, shortness breath, hematemesis, melena, hematochezia, constipation, obstipation, dysuria, hematuria, numbness, tingling, focal weakness, recent travel, suspicious food intake, alcohol use, NSAID use, or known sick contacts.    The history is provided by the patient and medical records. No language interpreter was used.  Emesis   Associated symptoms include abdominal pain and diarrhea. Pertinent negatives include no arthralgias, no chills, no fever and no myalgias.  Diarrhea   Associated symptoms include abdominal pain and vomiting. Pertinent negatives include no chills, no arthralgias and no myalgias.  Abdominal Pain   This is a new problem. The current episode started 6 to 12 hours ago. The problem occurs constantly. The problem has not changed since onset.The pain is associated with an unknown factor. The pain is located in the generalized abdominal region. The quality of the pain is aching. The pain is at a severity of 8/10. The pain is moderate. Associated symptoms include diarrhea, nausea and vomiting. Pertinent negatives include fever, flatus, hematochezia, melena, constipation, dysuria, hematuria, arthralgias and myalgias. Nothing aggravates the symptoms. Nothing relieves  the symptoms.    History reviewed. No pertinent past medical history.  Patient Active Problem List   Diagnosis Date Noted  . TB SKIN TEST, POSITIVE 08/03/2005    Past Surgical History:  Procedure Laterality Date  . APPENDECTOMY         Home Medications    Prior to Admission medications   Medication Sig Start Date End Date Taking? Authorizing Provider  dicyclomine (BENTYL) 20 MG tablet Take 1 tablet (20 mg total) by mouth 2 (two) times daily. Take, as needed, for abdominal cramping. 11/04/15   Antony MaduraKelly Humes, PA-C  omeprazole (PRILOSEC) 20 MG capsule Take 1 capsule (20 mg total) by mouth daily. 11/06/15   Derwood KaplanAnkit Nanavati, MD  ondansetron (ZOFRAN ODT) 4 MG disintegrating tablet Take 2 tablets (8 mg total) by mouth every 8 (eight) hours as needed for nausea or vomiting. 11/06/15   Derwood KaplanAnkit Nanavati, MD  promethazine (PHENERGAN) 25 MG suppository Place 1 suppository (25 mg total) rectally every 6 (six) hours as needed for nausea or vomiting (Use ONLY if unable to tolerate zofran by mouth). 11/04/15   Antony MaduraKelly Humes, PA-C    Family History Family History  Problem Relation Age of Onset  . Hypertension Other     Social History Social History  Substance Use Topics  . Smoking status: Never Smoker  . Smokeless tobacco: Never Used  . Alcohol use Yes     Comment: occ     Allergies   Patient has no known allergies.   Review of Systems Review of Systems  Constitutional: Negative for chills and fever.  Respiratory: Negative for shortness of breath.   Cardiovascular: Negative for chest pain.  Gastrointestinal: Positive for abdominal pain, diarrhea, nausea and  vomiting. Negative for blood in stool, constipation, flatus, hematochezia and melena.  Genitourinary: Negative for dysuria and hematuria.  Musculoskeletal: Negative for arthralgias and myalgias.  Skin: Negative for color change.  Allergic/Immunologic: Negative for immunocompromised state.  Neurological: Negative for weakness and  numbness.  Psychiatric/Behavioral: Negative for confusion.   10 Systems reviewed and are negative for acute change except as noted in the HPI.   Physical Exam Updated Vital Signs BP 128/71 (BP Location: Left Arm)   Pulse (!) 50   Temp 97.6 F (36.4 C) (Oral)   Resp 18   Wt 65.8 kg   SpO2 100%   BMI 22.05 kg/m   Physical Exam  Constitutional: He is oriented to person, place, and time. Vital signs are normal. He appears well-developed and well-nourished.  Non-toxic appearance. He appears distressed (dry heaving).  Afebrile, nontoxic, dry heaving during exam  HENT:  Head: Normocephalic and atraumatic.  Mouth/Throat: Oropharynx is clear and moist. Mucous membranes are dry.  Dry mucous membranes  Eyes: Conjunctivae and EOM are normal. Right eye exhibits no discharge. Left eye exhibits no discharge.  Neck: Normal range of motion. Neck supple.  Cardiovascular: Regular rhythm, normal heart sounds and intact distal pulses.  Bradycardia present.  Exam reveals no gallop and no friction rub.   No murmur heard. Sinus bradycardia HR 40-50s, similar to prior ED visits, with reg rhythm, nl s1/s2, no m/r/g, distal pulses intact  Pulmonary/Chest: Effort normal and breath sounds normal. No respiratory distress. He has no decreased breath sounds. He has no wheezes. He has no rhonchi. He has no rales.  Abdominal: Soft. Normal appearance and bowel sounds are normal. He exhibits no distension. There is no tenderness. There is no rigidity, no rebound, no guarding, no CVA tenderness, no tenderness at McBurney's point and negative Murphy's sign.  Soft, nondistended, +BS throughout, without any focal area of tenderness, reports mild discomfort generalized throughout but denies that it's painful, no r/g/r, neg murphy's, neg mcburney's, no CVA TTP   Musculoskeletal: Normal range of motion.  Neurological: He is alert and oriented to person, place, and time. He has normal strength. No sensory deficit.  Skin: Skin  is warm, dry and intact. No rash noted.  Psychiatric: He has a normal mood and affect.  Nursing note and vitals reviewed.    ED Treatments / Results  Labs (all labs ordered are listed, but only abnormal results are displayed) Labs Reviewed  COMPREHENSIVE METABOLIC PANEL - Abnormal; Notable for the following:       Result Value   Potassium 3.3 (*)    Glucose, Bld 126 (*)    ALT 16 (*)    All other components within normal limits  CBC - Abnormal; Notable for the following:    WBC 12.9 (*)    RBC 5.99 (*)    MCV 74.1 (*)    MCH 23.9 (*)    All other components within normal limits  URINALYSIS, ROUTINE W REFLEX MICROSCOPIC (NOT AT [facility]) - Abnormal; Notable for the following:    Color, Urine AMBER (*)    Specific Gravity, Urine 1.041 (*)    Ketones, ur 15 (*)    Protein, ur 30 (*)    All other components within normal limits  URINE MICROSCOPIC-ADD ON - Abnormal; Notable for the following:    Squamous Epithelial / LPF 0-5 (*)    Bacteria, UA RARE (*)    All other components within normal limits  LIPASE, BLOOD    EKG  EKG Interpretation None  Radiology No results found.  Procedures Procedures (including critical care time)  Medications Ordered in ED Medications  ondansetron (ZOFRAN-ODT) disintegrating tablet 4 mg (4 mg Oral Given 04/17/16 1408)  promethazine (PHENERGAN) injection 25 mg (25 mg Intravenous Given 04/17/16 1743)  sodium chloride 0.9 % bolus 2,000 mL (2,000 mLs Intravenous New Bag/Given 04/17/16 1742)  potassium chloride SA (K-DUR,KLOR-CON) CR tablet 40 mEq (40 mEq Oral Given 04/17/16 2020)     Initial Impression / Assessment and Plan / ED Course  I have reviewed the triage vital signs and the nursing notes.  Pertinent labs & imaging results that were available during my care of the patient were reviewed by me and considered in my medical decision making (see chart for details).  Clinical Course     25 y.o. male here with n/v/d/abd pain that  began today. On exam, no focal abd TTP, dry heaving, dry mucous membranes. Sinus brady which is c/w his prior visits, pt athletic which is likely why he's bradycardic. CMP with mildly low K, will need to replete once he can tolerate PO intake; otherwise WNL unremarkable. Lipase WNL. CBC with mildly elevated WBC 12.9. Awaiting U/A. Doubt need for imaging or other work up at this time, will await U/A, give fluids and phenergan, and reassess shortly. Likely viral gastroenteritis.   8:17 PM U/A without evidence of infection, but has signs of slight dehydration, otherwise unremarkable. Pt feeling better, nausea improved. Will give Kdur and PO challenge; as long as he tolerates well, he can be discharged with phenergan, discussed tylenol/motrin/BRAT diet, and f/up with PCP in 1wk. Will reassess after PO challenged.Marland Kitchen   8:43 PM Tolerating PO well. D/c home with previously discussed plan. I explained the diagnosis and have given explicit precautions to return to the ER including for any other new or worsening symptoms. The patient understands and accepts the medical plan as it's been dictated and I have answered their questions. Discharge instructions concerning home care and prescriptions have been given. The patient is STABLE and is discharged to home in good condition.   Final Clinical Impressions(s) / ED Diagnoses   Final diagnoses:  Viral gastroenteritis  Nausea vomiting and diarrhea  Hypokalemia  Leukocytosis, unspecified type  Bradycardia, sinus    New Prescriptions New Prescriptions   PROMETHAZINE (PHENERGAN) 25 MG TABLET    Take 1 tablet (25 mg total) by mouth every 6 (six) hours as needed for nausea or vomiting.     Arjun Hard Camprubi-Soms, PA-C 04/17/16 2043    Linwood Dibbles, MD 04/19/16 [phone]

## 2016-04-17 NOTE — Progress Notes (Signed)
EDCM spoke to  Patient at bedside.  Patient listed as having Medicaid insurance, however patient is coming up in EPIC as not being eligible for coverage.  [facility]   [facility], informed patient of services there.  EDCM also provided patient with list of pcps who accept self pay patients, list of discount pharmacies and websites needymeds.org and GoodRX.com for medication assistance, phone number to inquire about the orange card, phone number to inquire about Medicaid, phone number to inquire about the Affordable Care Act, financial resources in the community such as local churches, [employer], urban ministries, and dental assistance for uninsured patients.  Patient thankful for resources.  No further EDCM needs at this time.

## 2016-04-17 NOTE — ED Notes (Signed)
Pt given urinal and informed of needed urine sample.

## 2016-04-20 ENCOUNTER — Encounter (HOSPITAL_COMMUNITY): Payer: Self-pay | Admitting: Nurse Practitioner

## 2016-04-20 ENCOUNTER — Emergency Department (HOSPITAL_COMMUNITY)
Admission: EM | Admit: 2016-04-20 | Discharge: 2016-04-21 | Disposition: A | Discharge status: Home / Self Care | Payer: BLUE CROSS/BLUE SHIELD | Attending: Physician Assistant | Admitting: Physician Assistant

## 2016-04-20 DIAGNOSIS — R112 Nausea with vomiting, unspecified: Secondary | ICD-10-CM | POA: Diagnosis present

## 2016-04-20 DIAGNOSIS — R197 Diarrhea, unspecified: Secondary | ICD-10-CM | POA: Insufficient documentation

## 2016-04-20 DIAGNOSIS — N3000 Acute cystitis without hematuria: Secondary | ICD-10-CM | POA: Diagnosis not present

## 2016-04-20 DIAGNOSIS — Z79899 Other long term (current) drug therapy: Secondary | ICD-10-CM | POA: Diagnosis not present

## 2016-04-20 LAB — URINALYSIS, ROUTINE W REFLEX MICROSCOPIC
Glucose, UA: NEGATIVE mg/dL
Hgb urine dipstick: NEGATIVE
KETONES UR: 15 mg/dL — AB
NITRITE: NEGATIVE
PH: 6 (ref 5.0–8.0)
Protein, ur: 100 mg/dL — AB
SPECIFIC GRAVITY, URINE: 1.038 — AB (ref 1.005–1.030)

## 2016-04-20 LAB — CBC WITH DIFFERENTIAL/PLATELET
BASOS ABS: 0 10*3/uL (ref 0.0–0.1)
Basophils Relative: 0 %
EOS PCT: 2 %
Eosinophils Absolute: 0.2 10*3/uL (ref 0.0–0.7)
HEMATOCRIT: 42.7 % (ref 39.0–52.0)
Hemoglobin: 14.5 g/dL (ref 13.0–17.0)
LYMPHS ABS: 1.7 10*3/uL (ref 0.7–4.0)
Lymphocytes Relative: 23 %
MCH: 24.6 pg — ABNORMAL LOW (ref 26.0–34.0)
MCHC: 34 g/dL (ref 30.0–36.0)
MCV: 72.4 fL — AB (ref 78.0–100.0)
MONOS PCT: 11 %
Monocytes Absolute: 0.8 10*3/uL (ref 0.1–1.0)
NEUTROS PCT: 64 %
Neutro Abs: 4.8 10*3/uL (ref 1.7–7.7)
PLATELETS: 180 10*3/uL (ref 150–400)
RBC: 5.9 MIL/uL — AB (ref 4.22–5.81)
RDW: 13.9 % (ref 11.5–15.5)
WBC: 7.5 10*3/uL (ref 4.0–10.5)

## 2016-04-20 LAB — RAPID URINE DRUG SCREEN, HOSP PERFORMED
Amphetamines: NOT DETECTED
BENZODIAZEPINES: NOT DETECTED
Barbiturates: NOT DETECTED
COCAINE: NOT DETECTED
OPIATES: NOT DETECTED
Tetrahydrocannabinol: POSITIVE — AB

## 2016-04-20 LAB — LIPASE, BLOOD: Lipase: 36 U/L (ref 11–51)

## 2016-04-20 LAB — COMPREHENSIVE METABOLIC PANEL
ALT: 18 U/L (ref 17–63)
ANION GAP: 9 (ref 5–15)
AST: 35 U/L (ref 15–41)
Albumin: 4.8 g/dL (ref 3.5–5.0)
Alkaline Phosphatase: 43 U/L (ref 38–126)
BILIRUBIN TOTAL: 2.2 mg/dL — AB (ref 0.3–1.2)
BUN: 20 mg/dL (ref 6–20)
CHLORIDE: 103 mmol/L (ref 101–111)
CO2: 25 mmol/L (ref 22–32)
Calcium: 9.3 mg/dL (ref 8.9–10.3)
Creatinine, Ser: 1.4 mg/dL — ABNORMAL HIGH (ref 0.61–1.24)
Glucose, Bld: 97 mg/dL (ref 65–99)
POTASSIUM: 3.6 mmol/L (ref 3.5–5.1)
Sodium: 137 mmol/L (ref 135–145)
TOTAL PROTEIN: 7.8 g/dL (ref 6.5–8.1)

## 2016-04-20 LAB — URINE MICROSCOPIC-ADD ON

## 2016-04-20 MED ORDER — CEPHALEXIN 500 MG PO CAPS: 500.0000 mg | ORAL_CAPSULE | Freq: Four times a day (QID) | ORAL | 0 refills | Status: AC

## 2016-04-20 MED ORDER — ONDANSETRON HCL 4 MG PO TABS: 4.0000 mg | ORAL_TABLET | Freq: Three times a day (TID) | ORAL | 0 refills | Status: DC | PRN

## 2016-04-20 MED ORDER — ONDANSETRON HCL 4 MG/2ML IJ SOLN
4.0000 mg | Freq: Once | INTRAMUSCULAR | Status: AC
  Administered 2016-04-20: 4 mg via INTRAVENOUS
  Filled 2016-04-20: qty 2

## 2016-04-20 MED ORDER — SODIUM CHLORIDE 0.9 % IV BOLUS (SEPSIS)
1000.0000 mL | Freq: Once | INTRAVENOUS | Status: AC
  Administered 2016-04-20: 1000 mL via INTRAVENOUS

## 2016-04-20 MED ORDER — DEXTROSE 5 % IV SOLN
1.0000 g | Freq: Once | INTRAVENOUS | Status: AC
  Administered 2016-04-20: 1 g via INTRAVENOUS
  Filled 2016-04-20: qty 10

## 2016-04-20 NOTE — ED Triage Notes (Signed)
Pt states his symptoms of N/V that he was seen for 3 days ago have not resolved. Denies hemoptysis.

## 2016-04-20 NOTE — Discharge Instructions (Signed)
You were found to have a urinary tract infection.  We gave you fluids and one dose of antibiotics. We have given you a prescription for abx and for zofran for nausea.  Please rest and return to follow up with your primary next week.

## 2016-04-20 NOTE — ED Notes (Signed)
Lab called stating that lab specimen was insufficient for study. Phlebotomist in main lab called at this time to attempt blood draw for cbc with diff, CMET and Lipase

## 2016-04-20 NOTE — ED Provider Notes (Addendum)
[facility]-EMERGENCY DEPT Provider Note   CSN: [id number] Arrival date & time: 04/20/16  1933     History   Chief Complaint Chief Complaint  Patient presents with  . Emesis    HPI Keylen Eckenrode is a 25 y.o. male.  HPI   Patient is a 25 year old male presenting with nausea vomiting diarrhea. Patient seen earlier this week for the same thing. Patient received a school note only for one day. However he said he felt badly over the last several days afterwards. Patient denies any abdominal pain just "soreness". Denies any fever. Denies any urinary symptoms. Denies any cough myalgias or other symptoms.    History reviewed. No pertinent past medical history.  Patient Active Problem List   Diagnosis Date Noted  . TB SKIN TEST, POSITIVE 08/03/2005    Past Surgical History:  Procedure Laterality Date  . APPENDECTOMY         Home Medications    Prior to Admission medications   Medication Sig Start Date End Date Taking? Authorizing Provider  promethazine (PHENERGAN) 25 MG tablet Take 1 tablet (25 mg total) by mouth every 6 (six) hours as needed for nausea or vomiting. 04/17/16  Yes Mercedes Camprubi-Soms, PA-C  cephALEXin (KEFLEX) 500 MG capsule Take 1 capsule (500 mg total) by mouth 4 (four) times daily. 04/20/16   Emely Fahy Lyn Brion Sossamon, MD  dicyclomine (BENTYL) 20 MG tablet Take 1 tablet (20 mg total) by mouth 2 (two) times daily. Take, as needed, for abdominal cramping. Patient not taking: Reported on 04/20/2016 11/04/15   Antony Madura, PA-C  omeprazole (PRILOSEC) 20 MG capsule Take 1 capsule (20 mg total) by mouth daily. Patient not taking: Reported on 04/20/2016 11/06/15   Derwood Kaplan, MD  ondansetron (ZOFRAN ODT) 4 MG disintegrating tablet Take 2 tablets (8 mg total) by mouth every 8 (eight) hours as needed for nausea or vomiting. Patient not taking: Reported on 04/20/2016 11/06/15   Derwood Kaplan, MD  promethazine (PHENERGAN) 25 MG suppository Place 1 suppository (25 mg total)  rectally every 6 (six) hours as needed for nausea or vomiting (Use ONLY if unable to tolerate zofran by mouth). Patient not taking: Reported on 04/20/2016 11/04/15   Antony Madura, PA-C    Family History Family History  Problem Relation Age of Onset  . Hypertension Other     Social History Social History  Substance Use Topics  . Smoking status: Never Smoker  . Smokeless tobacco: Never Used  . Alcohol use Yes     Comment: occ     Allergies   Patient has no known allergies.   Review of Systems Review of Systems  Constitutional: Negative for activity change.  Respiratory: Negative for shortness of breath.   Cardiovascular: Negative for chest pain.  Gastrointestinal: Positive for diarrhea, nausea and vomiting. Negative for abdominal pain.  All other systems reviewed and are negative.    Physical Exam Updated Vital Signs BP 123/85 (BP Location: Left Arm)   Pulse 60   Temp 98.4 F (36.9 C) (Oral)   Resp 18   SpO2 100%   Physical Exam  Constitutional: He is oriented to person, place, and time. He appears well-nourished.  HENT:  Head: Normocephalic.  Eyes: Conjunctivae are normal.  Cardiovascular: Normal rate.   Pulmonary/Chest: Effort normal.  Abdominal: Soft. Bowel sounds are normal. He exhibits no distension. There is no tenderness.  Neurological: He is oriented to person, place, and time.  Skin: Skin is warm and dry. He is not diaphoretic.  Psychiatric: He has  a normal mood and affect. His behavior is normal.     ED Treatments / Results  Labs (all labs ordered are listed, but only abnormal results are displayed) Labs Reviewed  COMPREHENSIVE METABOLIC PANEL - Abnormal; Notable for the following:       Result Value   Creatinine, Ser 1.40 (*)    Total Bilirubin 2.2 (*)    All other components within normal limits  CBC WITH DIFFERENTIAL/PLATELET - Abnormal; Notable for the following:    RBC 5.90 (*)    MCV 72.4 (*)    MCH 24.6 (*)    All other components  within normal limits  URINALYSIS, ROUTINE W REFLEX MICROSCOPIC (NOT AT [facility]) - Abnormal; Notable for the following:    Color, Urine AMBER (*)    Specific Gravity, Urine 1.038 (*)    Bilirubin Urine SMALL (*)    Ketones, ur 15 (*)    Protein, ur 100 (*)    Leukocytes, UA TRACE (*)    All other components within normal limits  RAPID URINE DRUG SCREEN, HOSP PERFORMED - Abnormal; Notable for the following:    Tetrahydrocannabinol POSITIVE (*)    All other components within normal limits  URINE MICROSCOPIC-ADD ON - Abnormal; Notable for the following:    Squamous Epithelial / LPF 0-5 (*)    Bacteria, UA FEW (*)    All other components within normal limits  LIPASE, BLOOD    EKG  EKG Interpretation None       Radiology No results found.  Procedures Procedures (including critical care time)  Medications Ordered in ED Medications  cefTRIAXone (ROCEPHIN) 1 g in dextrose 5 % 50 mL IVPB (not administered)  sodium chloride 0.9 % bolus 1,000 mL (0 mLs Intravenous Stopped 04/20/16 2217)  ondansetron (ZOFRAN) injection 4 mg (4 mg Intravenous Given 04/20/16 2115)  sodium chloride 0.9 % bolus 1,000 mL (1,000 mLs Intravenous New Bag/Given 04/20/16 2217)     Initial Impression / Assessment and Plan / ED Course  I have reviewed the triage vital signs and the nursing notes.  Pertinent labs & imaging results that were available during my care of the patient were reviewed by me and considered in my medical decision making (see chart for details).  Clinical Course     Patient is a 25 year old male presenting with nausea vomiting diarrhea. Patient reports she's had the symptoms for about 6 days.  Patient felt better when he was treated here in the emergency department but was unable to return to school and is here for a school note because of the days in between. Patient reports he still feels mildly unwell. No abdominal pain..  Will get labs.  Would not pursue imaging given normal vital  signs and physical exam.  Pt has UTI.  Will treat.  Pt also UDS + marijuana. 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  Pt had this last year as well, it took 1 week to get over. Without his father in the room, pt admits to using marijuana and will stop in order to reduce these symptoms.   Patient tolerating crackers and ginger ale.  Normal vital signs.  No abdominal pain, therefore no reason for imaging.  Strict return precautions expressed.   Pt vomited prior to discharge, but states he feels so much better than arrival and wants to return home.    Final Clinical Impressions(s) / ED Diagnoses   Final diagnoses:  Nausea vomiting and diarrhea  Acute cystitis without hematuria    New Prescriptions New Prescriptions   CEPHALEXIN (KEFLEX) 500 MG CAPSULE    Take 1 capsule (500 mg total) by mouth 4 (four) times daily.     Tavaughn Silguero Randall AnLyn Sophy Mesler, MD 04/20/16 2257    Avram Danielson Randall AnLyn Ila Landowski, MD 04/21/16 [phone]

## 2016-04-20 NOTE — ED Notes (Signed)
Attempted to draw blood back from PIV was unsuccessful however PIV flushes well and Fluids infusing to gravity. Phlebotomy attempted to draw blood as well however was unable to obtain lavender tube at this time. Will notify MD and attempt redraw after bolus has transfused.

## 2016-04-21 MED ORDER — ONDANSETRON HCL 4 MG PO TABS
4.0000 mg | ORAL_TABLET | Freq: Once | ORAL | Status: AC
  Administered 2016-04-21: 4 mg via ORAL
  Filled 2016-04-21: qty 1

## 2016-04-22 LAB — URINE CULTURE: CULTURE: NO GROWTH

## 2016-10-11 ENCOUNTER — Encounter (HOSPITAL_COMMUNITY): Payer: Self-pay | Admitting: Emergency Medicine

## 2016-10-11 ENCOUNTER — Emergency Department (HOSPITAL_COMMUNITY)
Admission: EM | Admit: 2016-10-11 | Discharge: 2016-10-12 | Disposition: A | Discharge status: Home / Self Care | Payer: BLUE CROSS/BLUE SHIELD | Attending: Emergency Medicine | Admitting: Emergency Medicine

## 2016-10-11 DIAGNOSIS — Z79899 Other long term (current) drug therapy: Secondary | ICD-10-CM | POA: Insufficient documentation

## 2016-10-11 DIAGNOSIS — R718 Other abnormality of red blood cells: Secondary | ICD-10-CM | POA: Insufficient documentation

## 2016-10-11 DIAGNOSIS — R112 Nausea with vomiting, unspecified: Secondary | ICD-10-CM

## 2016-10-11 DIAGNOSIS — R17 Unspecified jaundice: Secondary | ICD-10-CM

## 2016-10-11 LAB — COMPREHENSIVE METABOLIC PANEL
ALBUMIN: 4.6 g/dL (ref 3.5–5.0)
ALT: 51 U/L (ref 17–63)
AST: 69 U/L — AB (ref 15–41)
Alkaline Phosphatase: 57 U/L (ref 38–126)
Anion gap: 12 (ref 5–15)
BILIRUBIN TOTAL: 1.4 mg/dL — AB (ref 0.3–1.2)
BUN: 19 mg/dL (ref 6–20)
CHLORIDE: 104 mmol/L (ref 101–111)
CO2: 23 mmol/L (ref 22–32)
Calcium: 9.7 mg/dL (ref 8.9–10.3)
Creatinine, Ser: 1.14 mg/dL (ref 0.61–1.24)
GFR calc Af Amer: >60 mL/min (ref >60)
GFR calc non Af Amer: >60 mL/min (ref >60)
GLUCOSE: 116 mg/dL — AB (ref 65–99)
POTASSIUM: 4.9 mmol/L (ref 3.5–5.1)
SODIUM: 139 mmol/L (ref 135–145)
TOTAL PROTEIN: 8.3 g/dL — AB (ref 6.5–8.1)

## 2016-10-11 LAB — CBC
HEMATOCRIT: 43 % (ref 39.0–52.0)
Hemoglobin: 14.1 g/dL (ref 13.0–17.0)
MCH: 24 pg — AB (ref 26.0–34.0)
MCHC: 32.8 g/dL (ref 30.0–36.0)
MCV: 73.1 fL — ABNORMAL LOW (ref 78.0–100.0)
Platelets: 193 10*3/uL (ref 150–400)
RBC: 5.88 MIL/uL — ABNORMAL HIGH (ref 4.22–5.81)
RDW: 14.6 % (ref 11.5–15.5)
WBC: 12 10*3/uL — ABNORMAL HIGH (ref 4.0–10.5)

## 2016-10-11 LAB — LIPASE, BLOOD: Lipase: 22 U/L (ref 11–51)

## 2016-10-11 NOTE — ED Triage Notes (Signed)
Pt states he has had nausea and vomiting since this morning  Pt states he feels weak all over  Denies diarrhea or abd pain

## 2016-10-12 LAB — URINALYSIS, ROUTINE W REFLEX MICROSCOPIC
Bilirubin Urine: NEGATIVE
Glucose, UA: NEGATIVE mg/dL
HGB URINE DIPSTICK: NEGATIVE
Ketones, ur: 80 mg/dL — AB
Leukocytes, UA: NEGATIVE
Nitrite: NEGATIVE
PROTEIN: NEGATIVE mg/dL
SPECIFIC GRAVITY, URINE: 1.028 (ref 1.005–1.030)
pH: 5 (ref 5.0–8.0)

## 2016-10-12 MED ORDER — ONDANSETRON HCL 4 MG/2ML IJ SOLN
4.0000 mg | Freq: Once | INTRAMUSCULAR | Status: AC
  Administered 2016-10-12: 4 mg via INTRAVENOUS
  Filled 2016-10-12: qty 2

## 2016-10-12 MED ORDER — SODIUM CHLORIDE 0.9 % IV BOLUS (SEPSIS)
1000.0000 mL | Freq: Once | INTRAVENOUS | Status: AC
  Administered 2016-10-12: 1000 mL via INTRAVENOUS

## 2016-10-12 MED ORDER — ONDANSETRON HCL 4 MG PO TABS: 4.0000 mg | ORAL_TABLET | Freq: Four times a day (QID) | ORAL | 0 refills | Status: AC | PRN

## 2016-10-12 NOTE — ED Provider Notes (Signed)
[facility]-EMERGENCY DEPT Provider Note   CSN: [id number] Arrival date & time: 10/11/16  2259  By signing my name below, I, Diona BrownerJennifer Gorman, attest that this documentation has been prepared under the direction and in the presence of Dione BoozeGlick, Sherleen Pangborn, MD. Electronically Signed: Diona BrownerJennifer Gorman, ED Scribe. 10/12/16. 12:34 AM.    History   Chief Complaint Chief Complaint  Patient presents with  . Emesis    HPI Ronnie Estes is a 26 y.o. male who presents to the Emergency Department complaining of intermittent emesis (11 x) that started the morning of 10/11/16. Associated sx include nausea, decreased appetite and generalized weakness. No one at home is sick. Pt denies diarrhea, fever, chills or any other sx at this time.   The history is provided by the patient. No language interpreter was used.    History reviewed. No pertinent past medical history.  Patient Active Problem List   Diagnosis Date Noted  . TB SKIN TEST, POSITIVE 08/03/2005    Past Surgical History:  Procedure Laterality Date  . APPENDECTOMY         Home Medications    Prior to Admission medications   Medication Sig Start Date End Date Taking? Authorizing Provider  cephALEXin (KEFLEX) 500 MG capsule Take 1 capsule (500 mg total) by mouth 4 (four) times daily. 04/20/16   Mackuen, Courteney Lyn, MD  dicyclomine (BENTYL) 20 MG tablet Take 1 tablet (20 mg total) by mouth 2 (two) times daily. Take, as needed, for abdominal cramping. Patient not taking: Reported on 04/20/2016 11/04/15   Antony MaduraHumes, Kelly, PA-C  omeprazole (PRILOSEC) 20 MG capsule Take 1 capsule (20 mg total) by mouth daily. Patient not taking: Reported on 04/20/2016 11/06/15   Derwood KaplanNanavati, Ankit, MD  ondansetron (ZOFRAN ODT) 4 MG disintegrating tablet Take 2 tablets (8 mg total) by mouth every 8 (eight) hours as needed for nausea or vomiting. Patient not taking: Reported on 04/20/2016 11/06/15   Derwood KaplanNanavati, Ankit, MD  ondansetron (ZOFRAN) 4 MG tablet Take 1 tablet (4 mg  total) by mouth every 8 (eight) hours as needed for nausea or vomiting. 04/20/16   Mackuen, Courteney Lyn, MD  promethazine (PHENERGAN) 25 MG suppository Place 1 suppository (25 mg total) rectally every 6 (six) hours as needed for nausea or vomiting (Use ONLY if unable to tolerate zofran by mouth). Patient not taking: Reported on 04/20/2016 11/04/15   Antony MaduraHumes, Kelly, PA-C  promethazine (PHENERGAN) 25 MG tablet Take 1 tablet (25 mg total) by mouth every 6 (six) hours as needed for nausea or vomiting. 04/17/16   Street, PrincetonMercedes, PA-C    Family History Family History  Problem Relation Age of Onset  . Hypertension Other   . Hypertension Mother     Social History Social History  Substance Use Topics  . Smoking status: Never Smoker  . Smokeless tobacco: Never Used  . Alcohol use Yes     Comment: occ     Allergies   Patient has no known allergies.   Review of Systems Review of Systems  Constitutional: Positive for appetite change. Negative for chills and fever.  Gastrointestinal: Positive for nausea and vomiting. Negative for diarrhea.  Neurological: Positive for weakness.  All other systems reviewed and are negative.    Physical Exam Updated Vital Signs BP (!) 141/92 (BP Location: Right Arm)   Pulse 84   Temp 98.2 F (36.8 C) (Oral)   Resp 20   Ht 5\' 8"  (1.727 m)   Wt 160 lb (72.6 kg)   SpO2 100%  BMI 24.33 kg/m   Physical Exam  Constitutional: He is oriented to person, place, and time. He appears well-developed and well-nourished.  HENT:  Head: Normocephalic and atraumatic.  Eyes: EOM are normal. Pupils are equal, round, and reactive to light.  Neck: Normal range of motion. Neck supple. No JVD present.  Cardiovascular: Normal rate, regular rhythm and normal heart sounds.   No murmur heard. Pulmonary/Chest: Effort normal and breath sounds normal. He has no wheezes. He has no rales. He exhibits no tenderness.  Abdominal: Soft. He exhibits no distension and no mass.  Bowel sounds are decreased. There is no tenderness.  Musculoskeletal: Normal range of motion. He exhibits no edema.  Lymphadenopathy:    He has no cervical adenopathy.  Neurological: He is alert and oriented to person, place, and time. No cranial nerve deficit. He exhibits normal muscle tone. Coordination normal.  Skin: Skin is warm and dry. No rash noted.  Psychiatric: He has a normal mood and affect. His behavior is normal. Judgment and thought content normal.  Nursing note and vitals reviewed.    ED Treatments / Results  DIAGNOSTIC STUDIES: Oxygen Saturation is 100% on RA, normal by my interpretation.   COORDINATION OF CARE: 12:29 AM-Discussed next steps with pt. Pt verbalized understanding and is agreeable with the plan.    Labs (all labs ordered are listed, but only abnormal results are displayed) Labs Reviewed  COMPREHENSIVE METABOLIC PANEL - Abnormal; Notable for the following:       Result Value   Glucose, Bld 116 (*)    Total Protein 8.3 (*)    AST 69 (*)    Total Bilirubin 1.4 (*)    All other components within normal limits  CBC - Abnormal; Notable for the following:    WBC 12.0 (*)    RBC 5.88 (*)    MCV 73.1 (*)    MCH 24.0 (*)    All other components within normal limits  URINALYSIS, ROUTINE W REFLEX MICROSCOPIC - Abnormal; Notable for the following:    Ketones, ur 80 (*)    All other components within normal limits  LIPASE, BLOOD     Procedures Procedures (including critical care time)  Medications Ordered in ED Medications  ondansetron (ZOFRAN) injection 4 mg (not administered)  sodium chloride 0.9 % bolus 1,000 mL (not administered)     Initial Impression / Assessment and Plan / ED Course  I have reviewed the triage vital signs and the nursing notes.  Pertinent labs & imaging results that were available during my care of the patient were reviewed by me and considered in my medical decision making (see chart for details).  Nausea and vomiting  in pattern strongly suggestive of viral gastritis. No red flags to suggest more serious illness. Screening labs to show mild leukocytosis and microcytic RBC indices. Microcytosis has been present in previous CBCs, consider sickle cell trait, thalassemia trait. Also, mild elevation of bilirubin which had been present previously, and mild elevation of AST, which had also been present in 2017. 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 He will be given IV hydration and IV ondansetron.  Following above-noted treatment, he is feeling much better. He is discharged with prescription for ondansetron.  Final Clinical Impressions(s) / ED Diagnoses   Final diagnoses:  Non-intractable vomiting with nausea, unspecified vomiting type  Microcytic erythrocytes  Serum total bilirubin elevated    New Prescriptions New Prescriptions   No medications on file   I personally performed the services described in  this documentation, which was scribed in my presence. The recorded information has been reviewed and is accurate.      Dione Booze, MD 10/12/16 [phone]

## 2016-12-12 IMAGING — CR DG ABDOMEN ACUTE W/ 1V CHEST
5 series · 5 of 5 positions shown · non-contrast
Comparison: Chest radiograph October 20, 2007

CLINICAL DATA: Abdominal pain with nausea and vomiting for 3 days

EXAM:
DG ABDOMEN ACUTE W/ 1V CHEST

[w abdomen decub (1 of 2)]
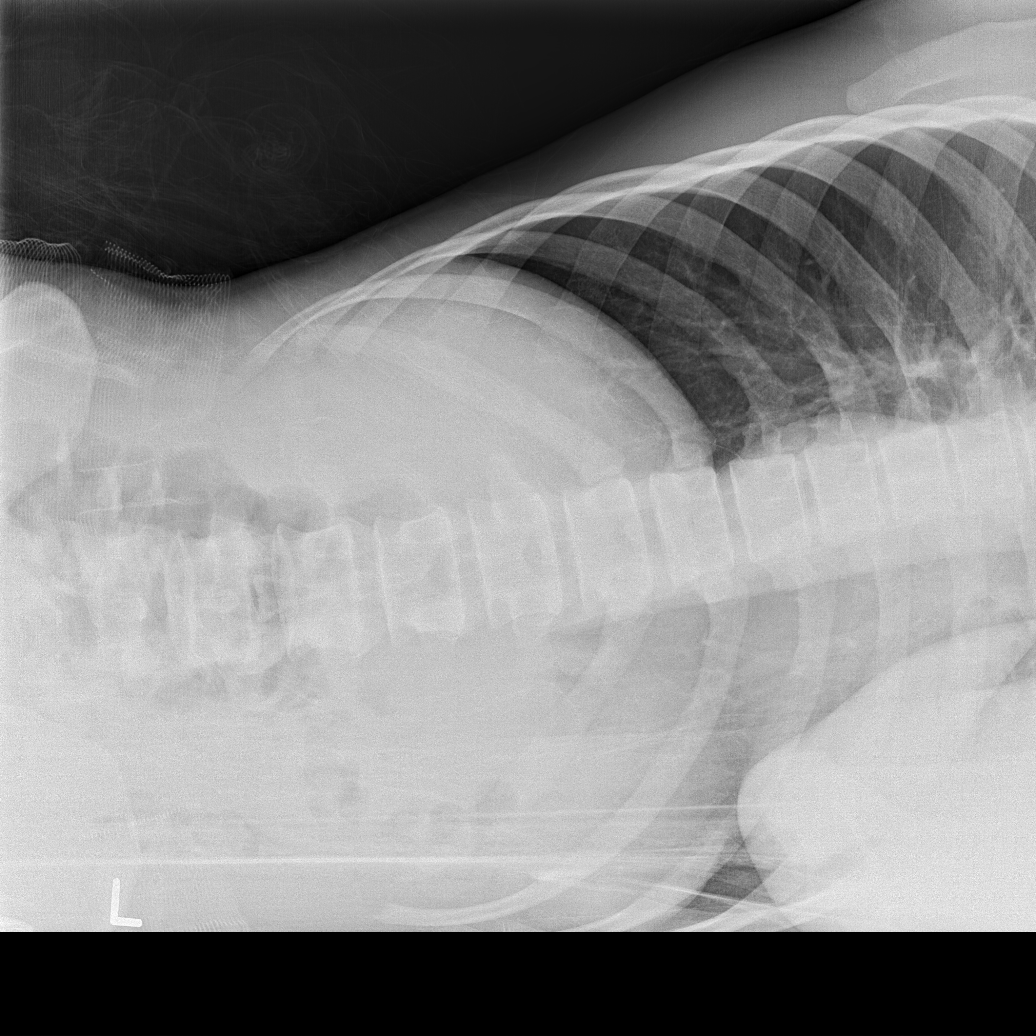

[w abdomen decub (2 of 2)]
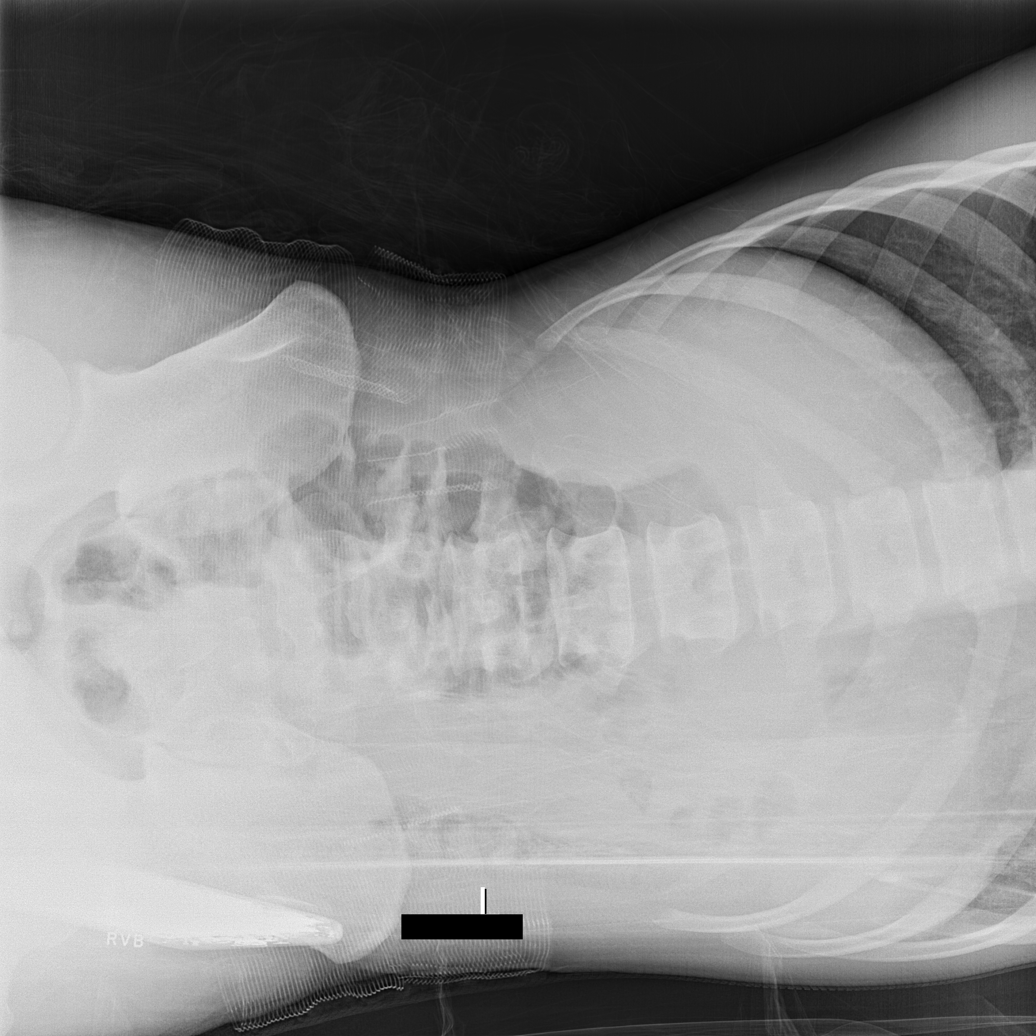

[x abdomen supine (1 of 2)]
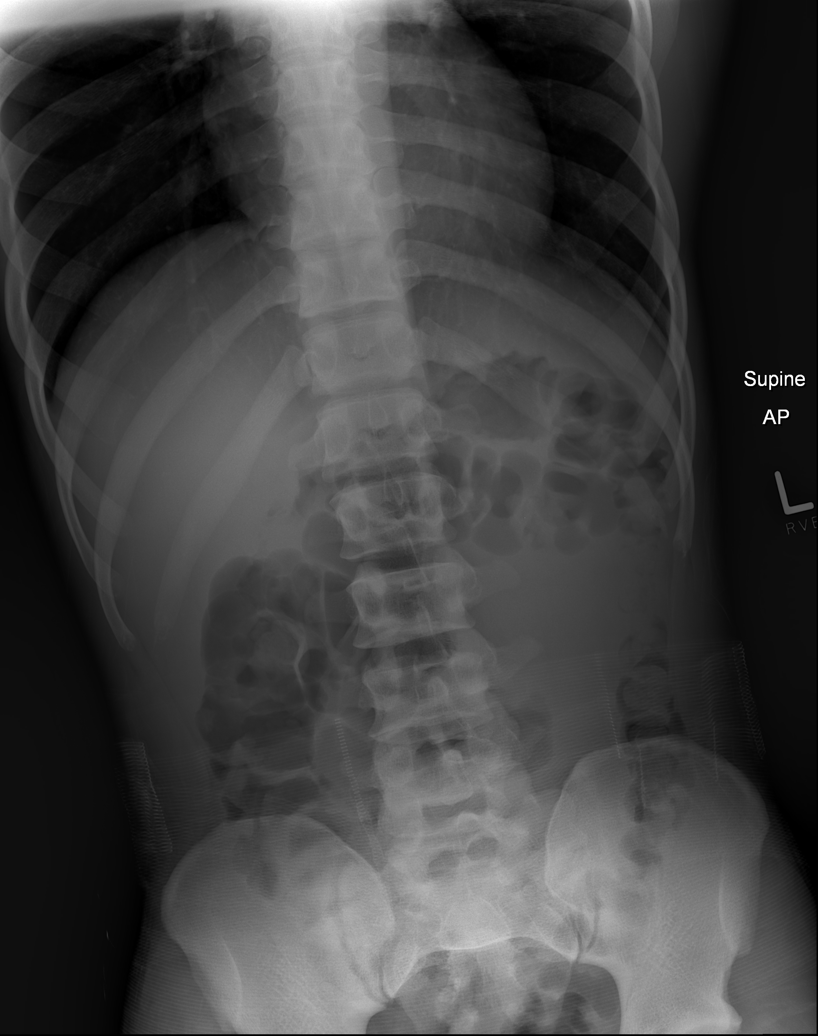

[x abdomen supine (2 of 2)]
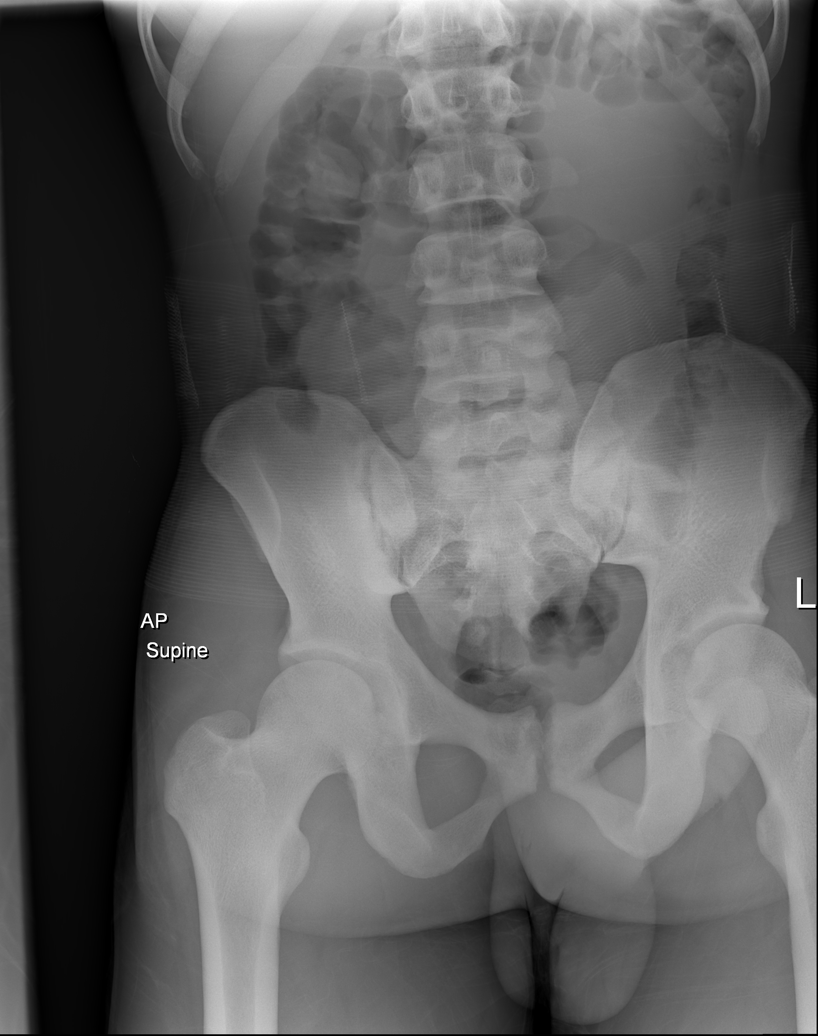

[x chest ap]
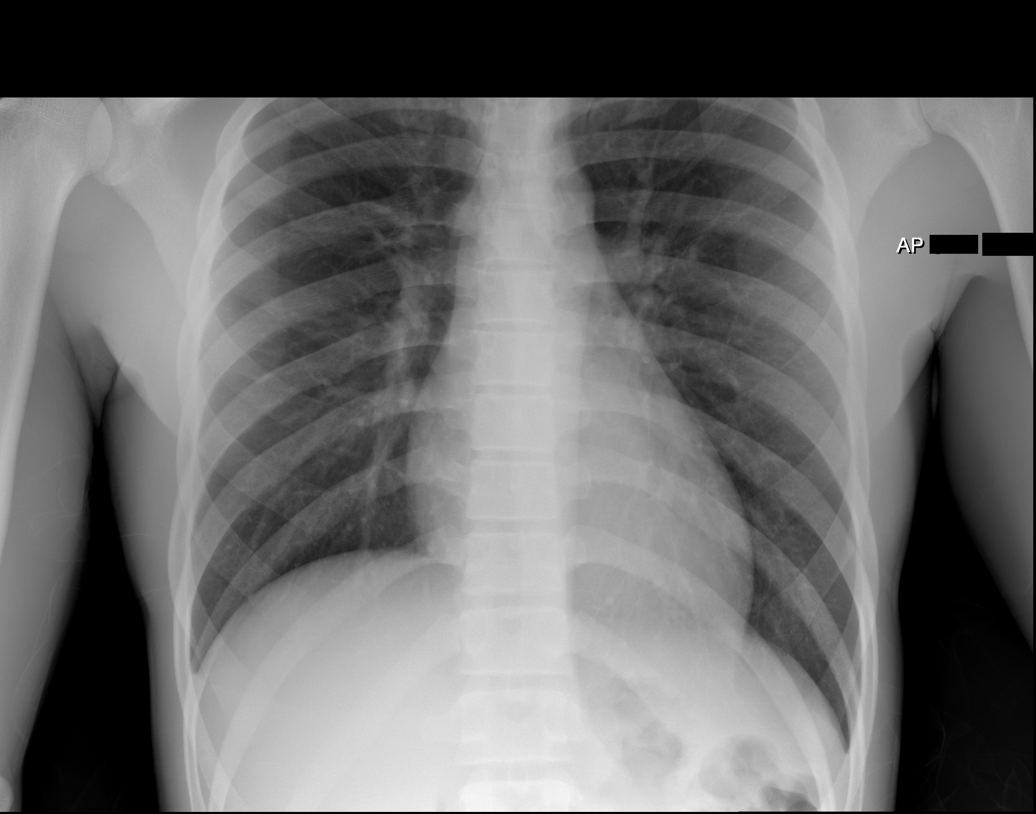

[5 of 5 positions shown; findings below may reference images not displayed]

FINDINGS: PA chest: Lungs are clear. Heart size and pulmonary vascularity are
normal. No adenopathy.

Supine and left lateral decubitus abdomen: There is moderate stool
in the colon. There is no bowel dilatation or air-fluid level
suggesting obstruction. No free air. No abnormal calcifications.
IMPRESSION: No demonstrable bowel obstruction or free air. Moderate stool in
colon. Lungs clear.
